# Patient Record
Sex: Male | Born: 1985 | Race: Black or African American | Hispanic: No | Marital: Single | Smoking: Current some day smoker
Health system: Southern US, Community
[De-identification: ages and names within clinical notes are randomized; demographics above are authoritative.]

## PROBLEM LIST (undated history)

## (undated) DIAGNOSIS — D573 Sickle-cell trait: Secondary | ICD-10-CM

---

## 2008-05-19 ENCOUNTER — Emergency Department: Payer: Self-pay | Admitting: Emergency Medicine

## 2009-04-19 ENCOUNTER — Emergency Department: Payer: Self-pay | Admitting: Emergency Medicine

## 2013-01-27 ENCOUNTER — Emergency Department: Payer: Self-pay | Admitting: Emergency Medicine

## 2014-05-23 ENCOUNTER — Ambulatory Visit: Payer: Self-pay | Admitting: Family Medicine

## 2015-06-11 IMAGING — CT CT CERVICAL SPINE WITHOUT CONTRAST
4 series · 16 of 33 positions shown, 19 images · non-contrast
Comparison: Prior radiograph performed earlier on the same day.

CLINICAL DATA: Pain following trauma, right arm weakness

EXAM:
CT CERVICAL SPINE WITHOUT CONTRAST
TECHNIQUE: Multidetector CT imaging of the cervical spine was performed without
intravenous contrast. Multiplanar CT image reconstructions were also
generated.

[Series 3: cor bone · coronal · 0.39mm/px · 3 of 42 slices shown]
[im 9/42  bone]
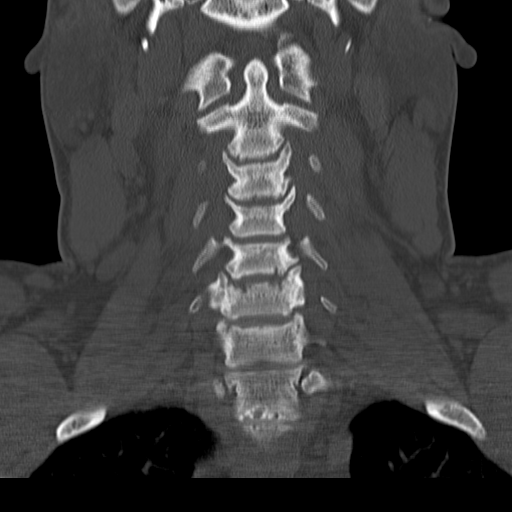
[im 17/42  bone]
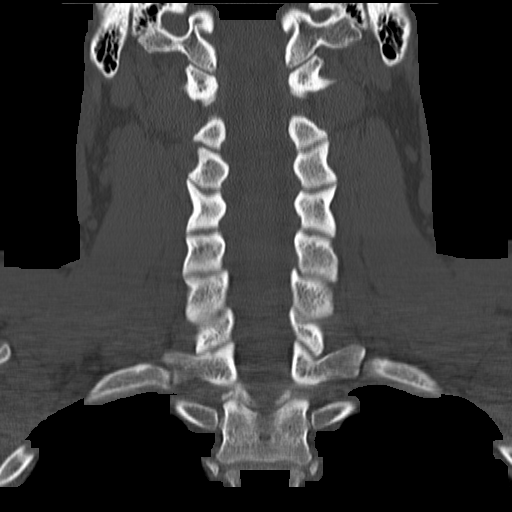
[im 25/42  bone]
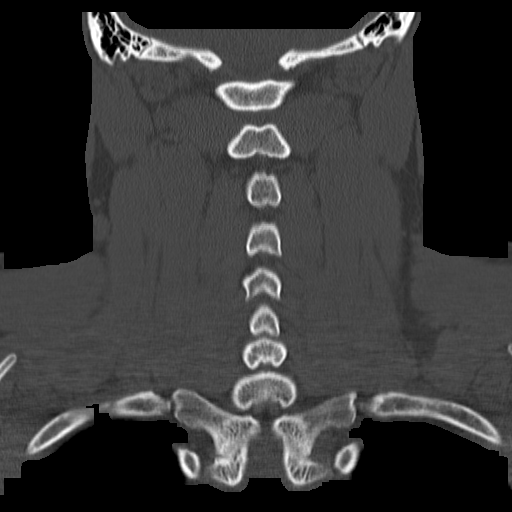

[Series 4: c spine soft · axial · 0.30mm/px · z∈[+938,+1000]mm · 3 of 93 slices shown]
[im 16/93  soft-tissue]
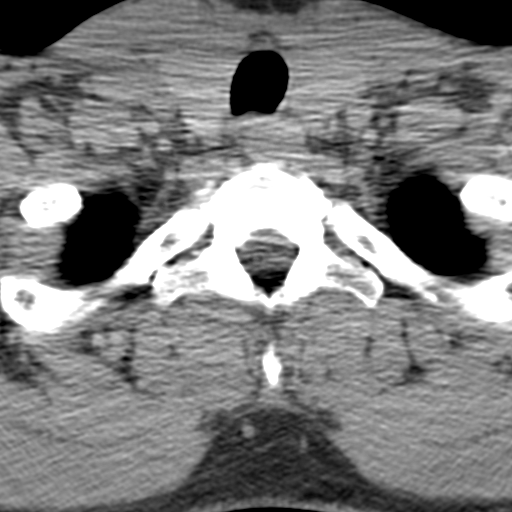
[im 31/93  soft-tissue]
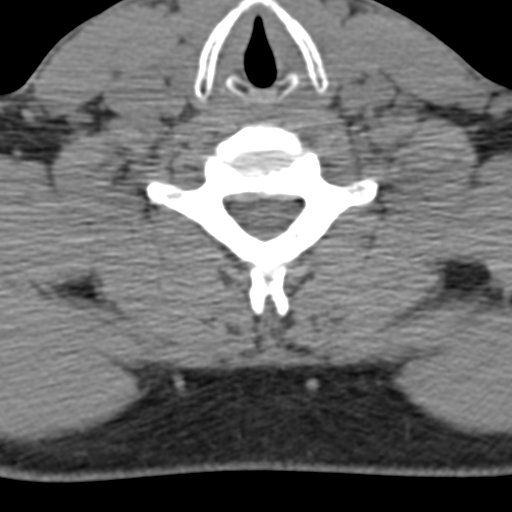
[im 47/93  soft-tissue]
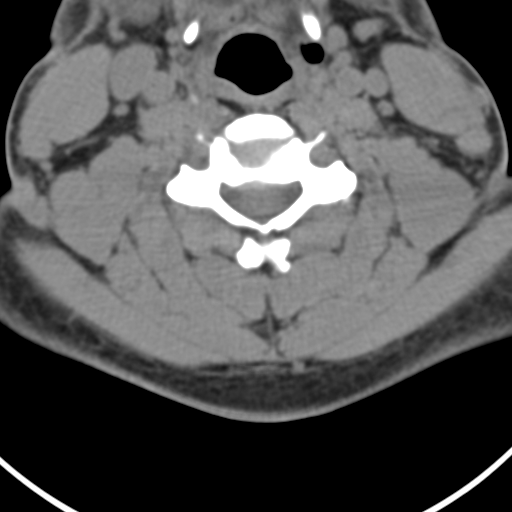

[Series 5: sag bone · sagittal · 0.39mm/px · 5 of 48 slices shown, 6 images]
[im 16/48  bone]
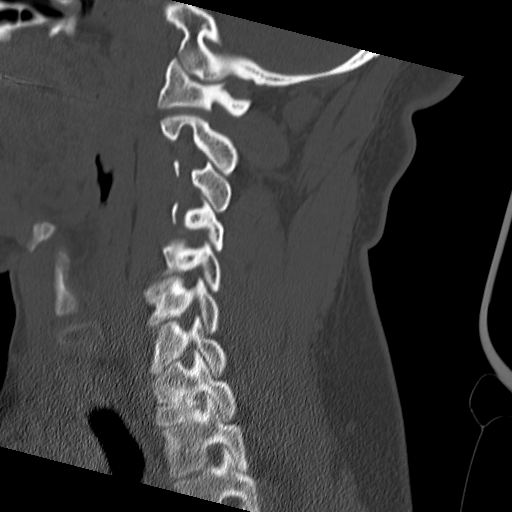
[im 20/48  bone]
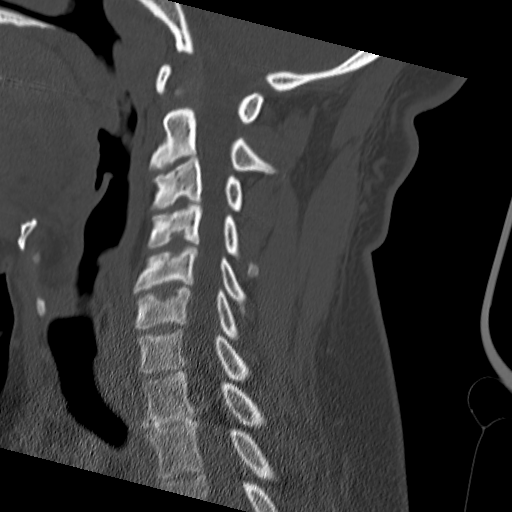
[im 24/48  soft-tissue]
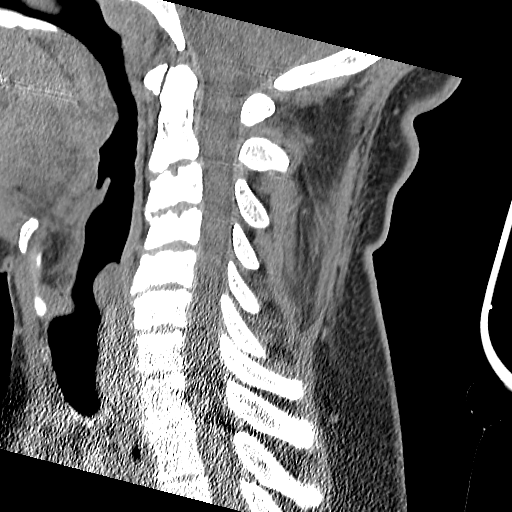
[im 24/48  bone]
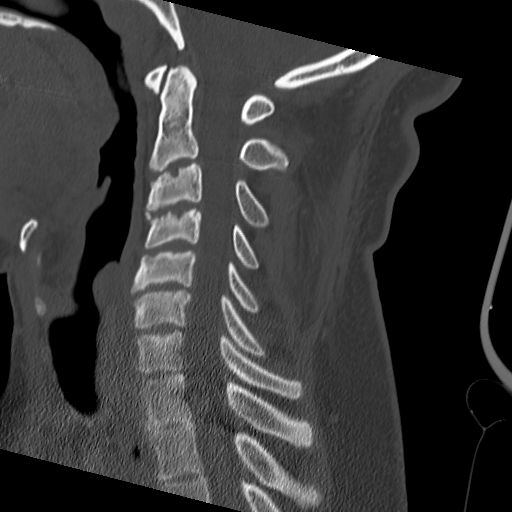
[im 28/48  bone]
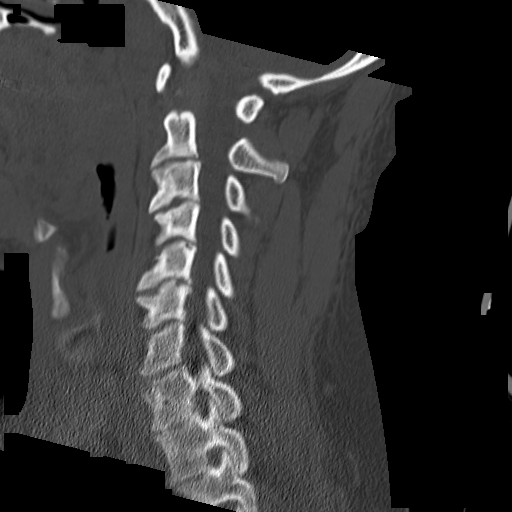
[im 32/48  bone]
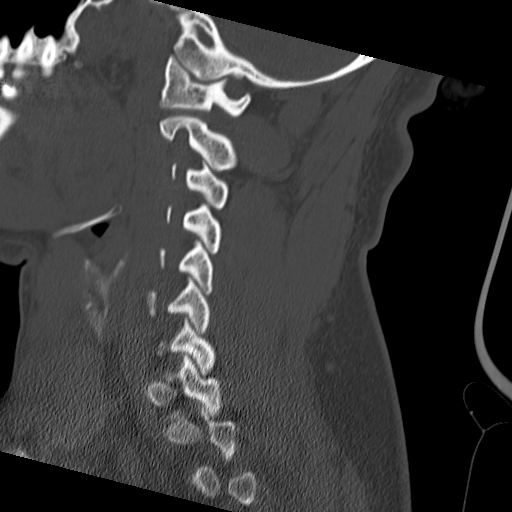

[Series 6: orthogonal axials · axial · 0.29mm/px · z∈[+921,+1037]mm · 5 of 92 slices shown, 7 images]
[im 16/92  soft-tissue]
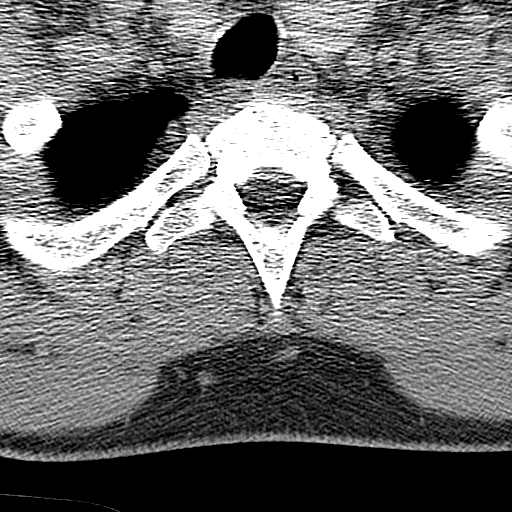
[im 16/92  bone]
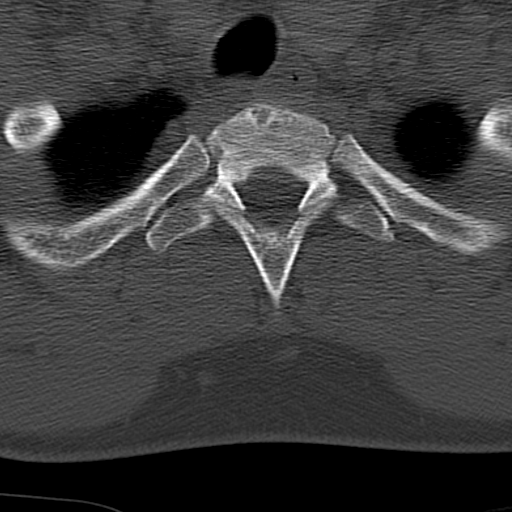
[im 31/92  bone]
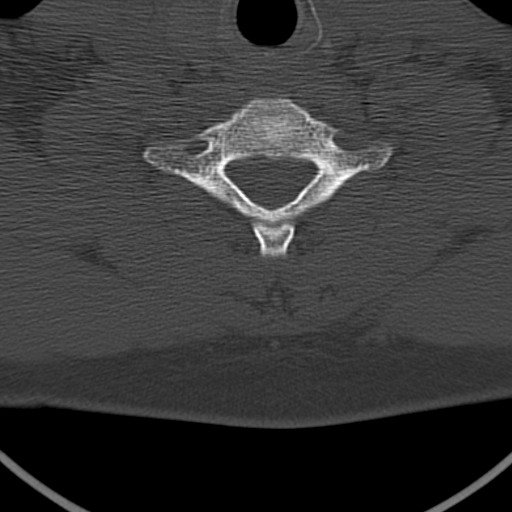
[im 46/92  bone]
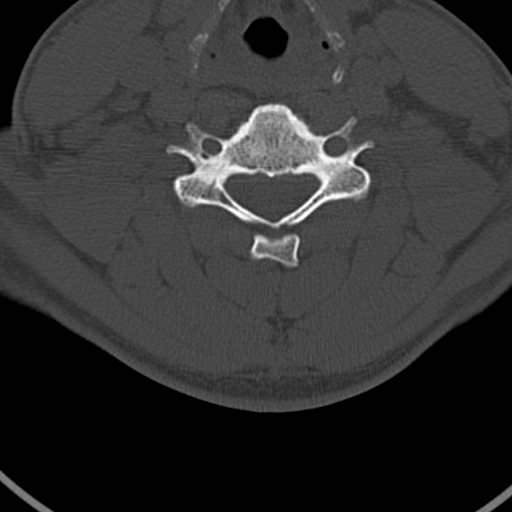
[im 61/92  bone]
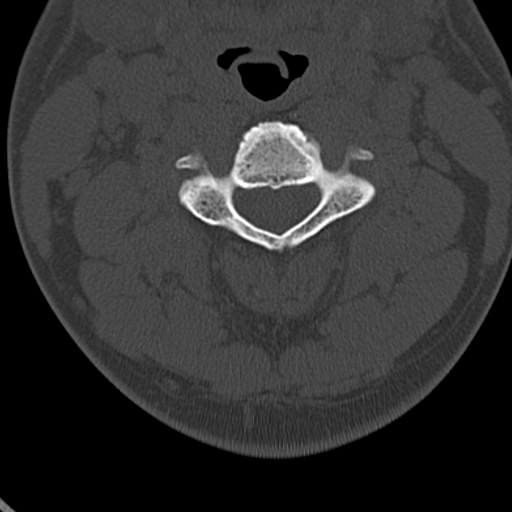
[im 76/92  soft-tissue]
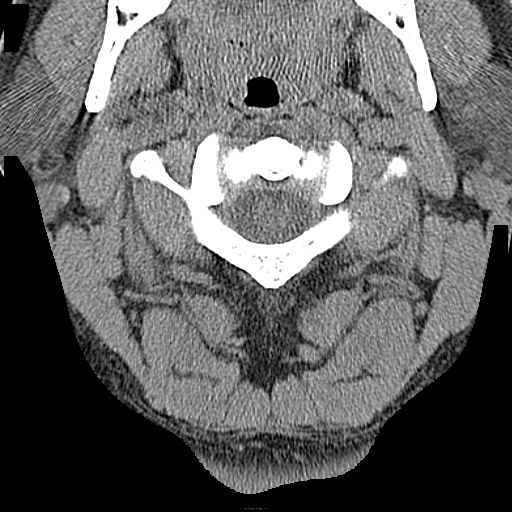
[im 76/92  bone]
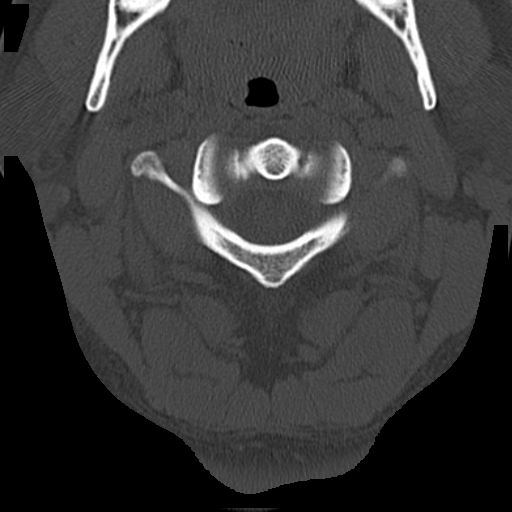

[16 of 33 positions shown; findings below may reference images not displayed]

FINDINGS: There is slight reversal the normal cervical lordosis with apex at
the C4 level. Multilevel degenerative Schmorl's nodes are seen, most
prominent at the superior endplates of C3, C4, and C6. There is
degenerative intervertebral disk space narrowing with endplate
osteophytosis throughout the cervical spine, most prominent at C3-4
and T1-2. Findings are relatively advanced for patient age.
Prevertebral soft tissues are normal. Normal C1-2 articulations are
intact. No acute fracture listhesis. Facet joints are normally
aligned.

The soft tissues of the neck are within normal limits. Visualized
lung apices are clear.
IMPRESSION: 1. No CT evidence of acute fracture or listhesis of the cervical
spine.
2. Multilevel degenerative disc disease with associated chronic
endplate changes and Schmorl's nodes, advanced for patient age.
These findings are most severe at C3-4.

## 2015-06-11 IMAGING — CR CERVICAL SPINE - COMPLETE 4+ VIEW
1 series · 6 of 6 positions shown · non-contrast
Comparison: None.

CLINICAL DATA: Fall 2 days ago, upper back and neck pain

EXAM:
CERVICAL SPINE  4+ VIEWS

[Series 1: w cervical spine lat · 0.14mm/px · 6 of 6 slices shown]
[im 1/6]
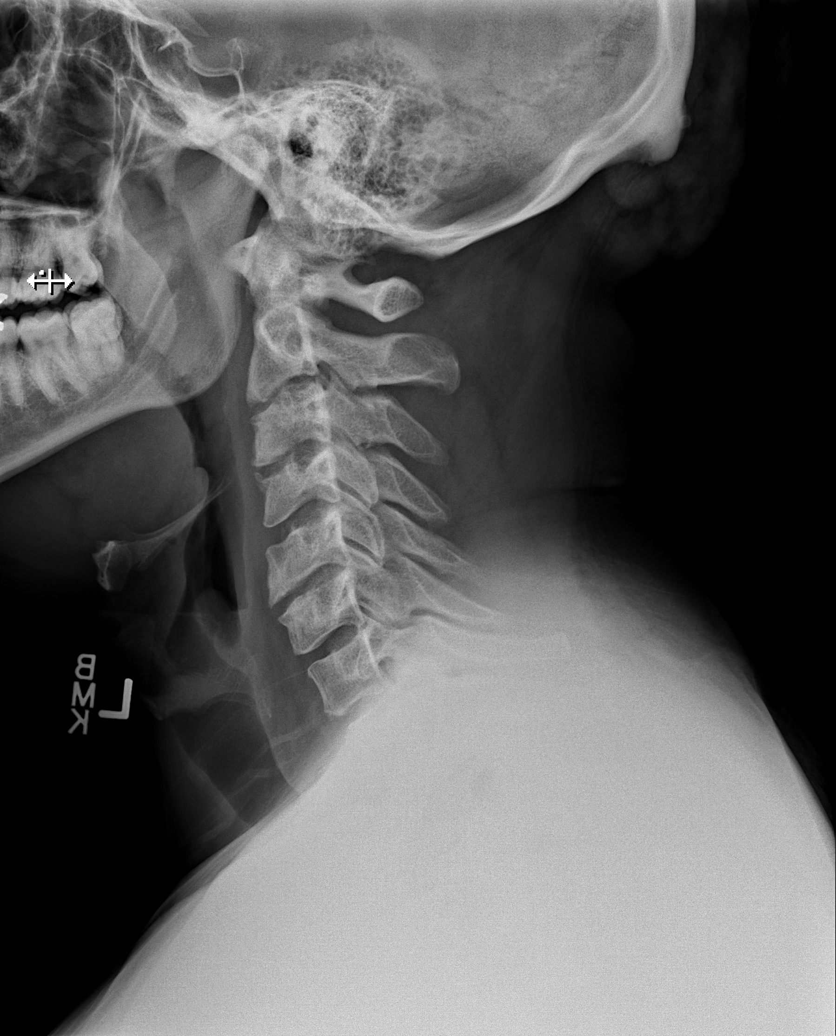
[im 2/6]
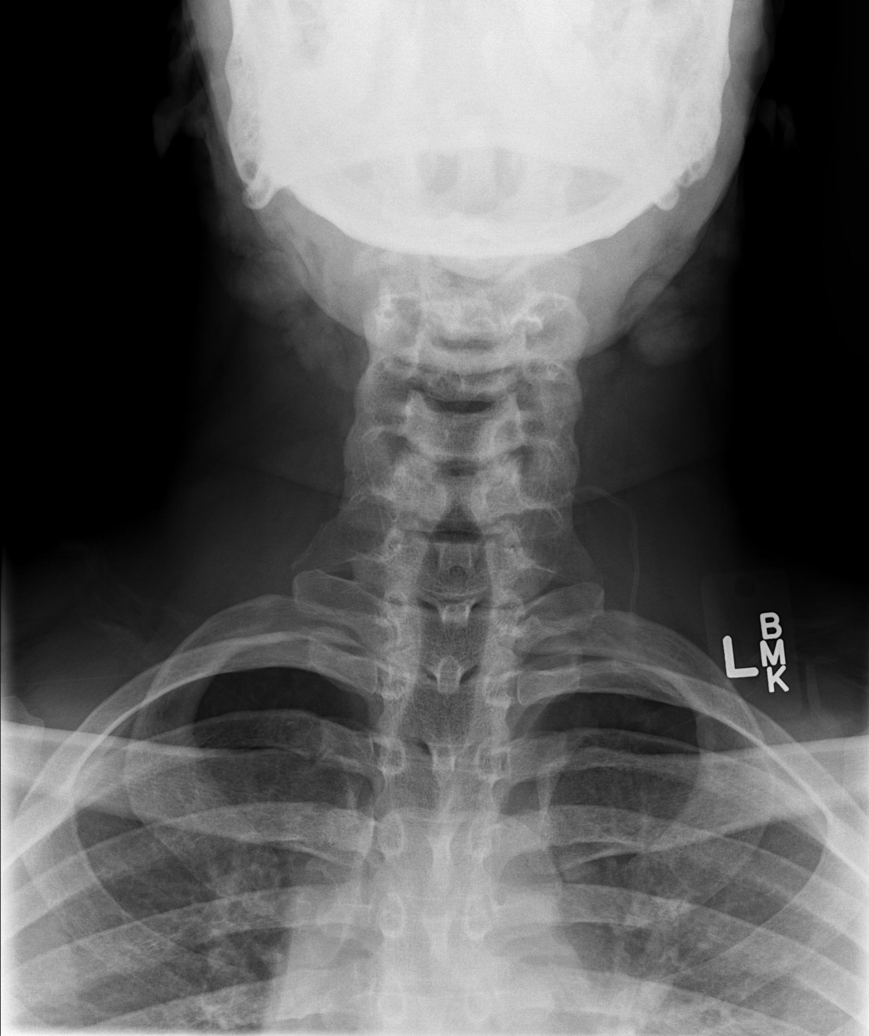
[im 3/6]
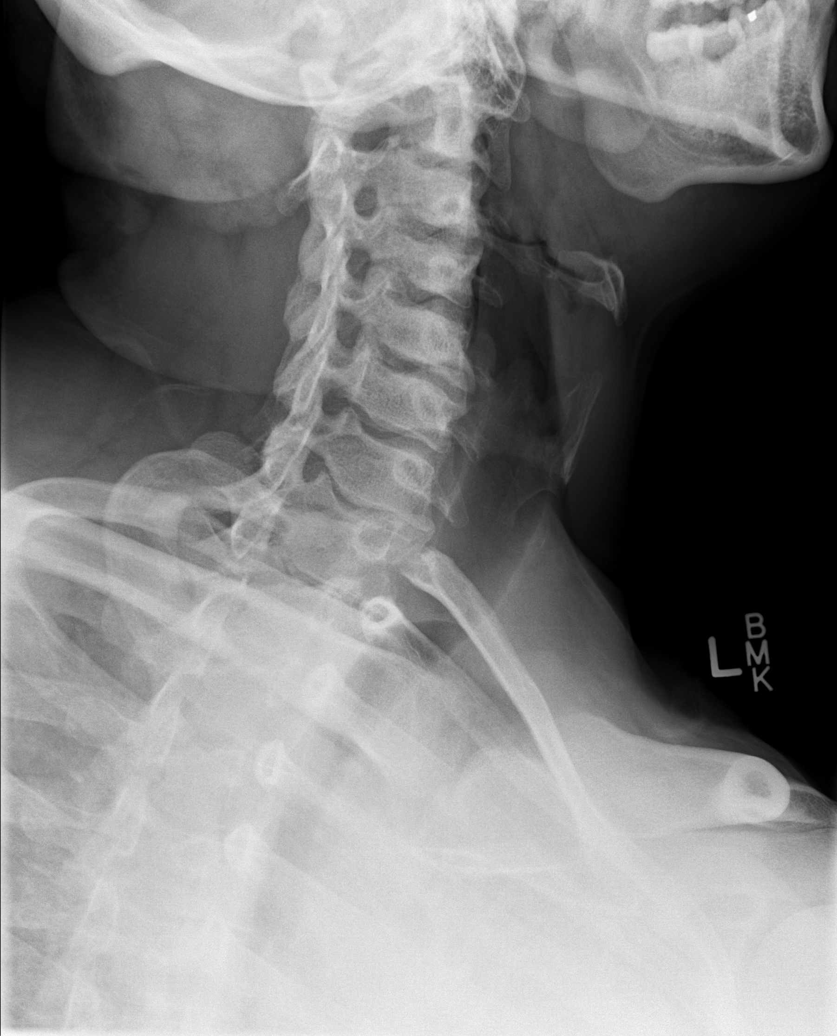
[im 4/6]
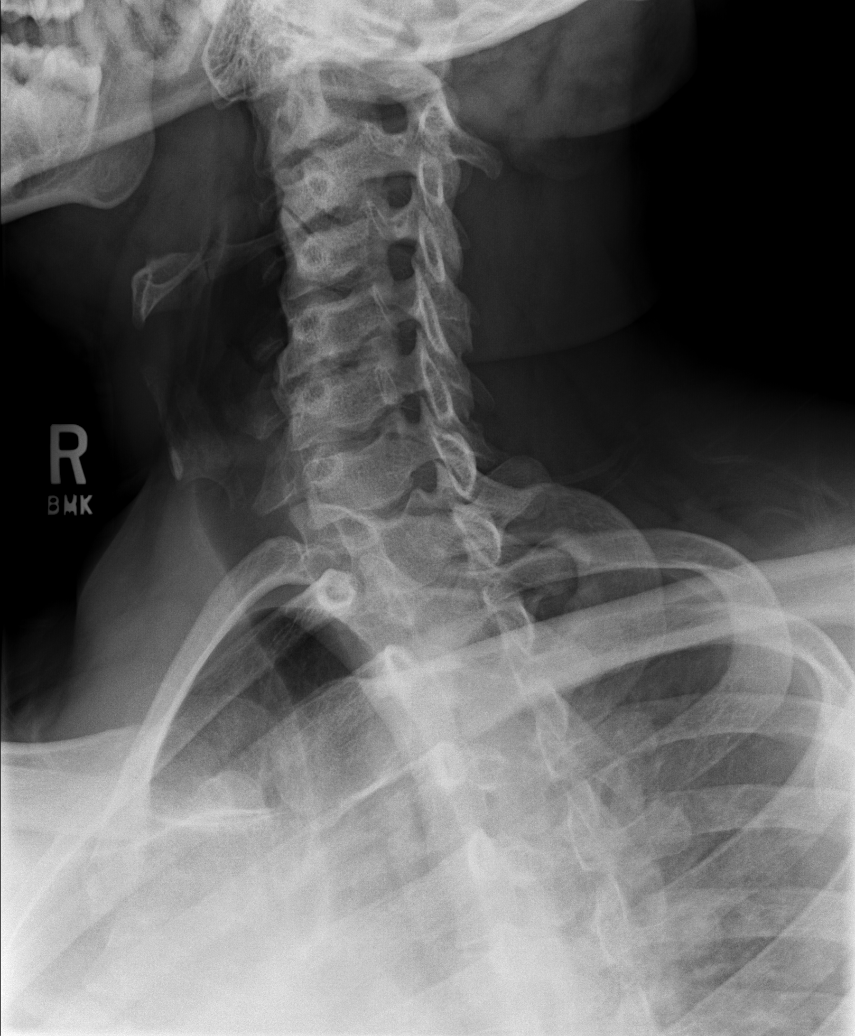
[im 5/6]
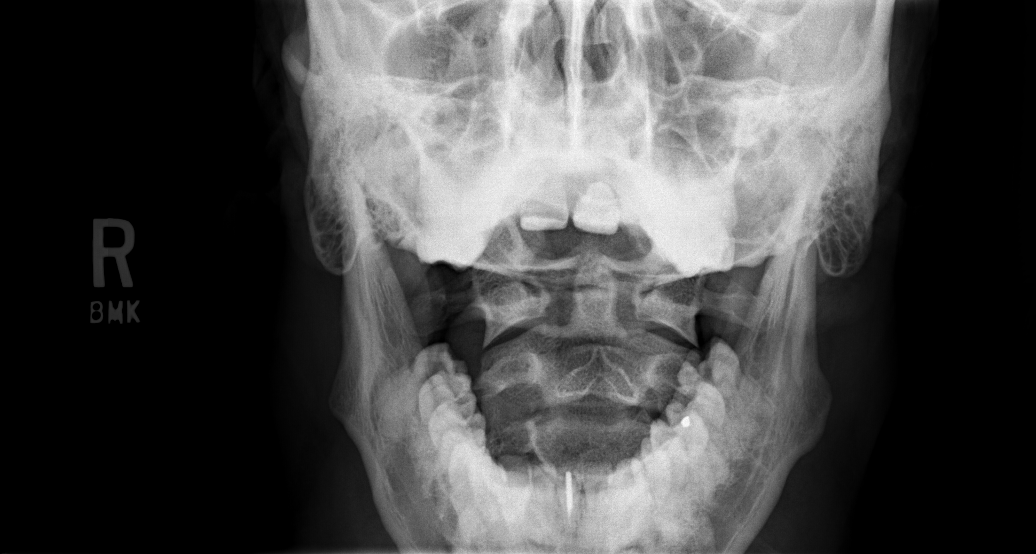
[im 6/6]
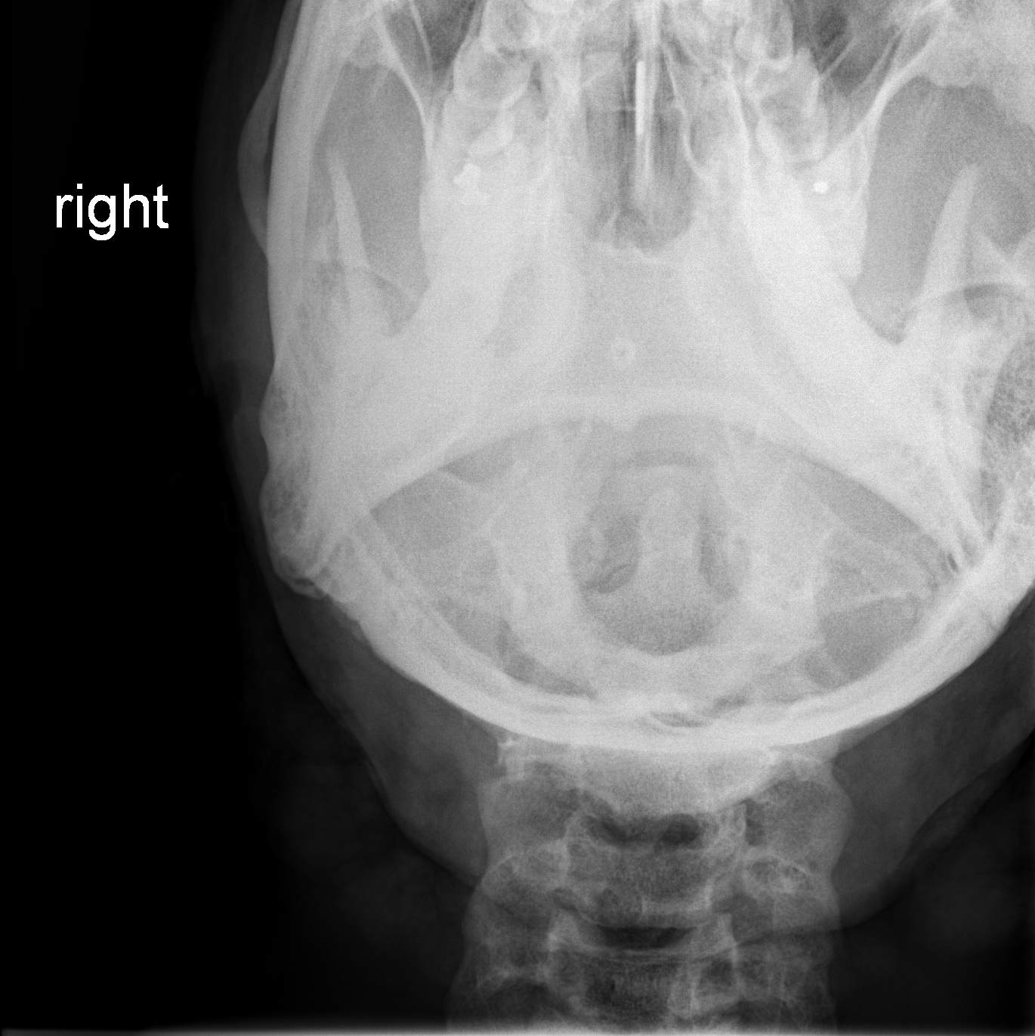

[6 of 6 positions shown; findings below may reference images not displayed]

FINDINGS: The vertebral bodies are normally aligned with preservation of the
normal lumbar lordosis. Degenerative Schmorl's node is present at
the superior endplate of C4. Vertebral body heights are preserved.
Multilevel degenerative disc disease as evidenced by intervertebral
disk space narrowing and endplate osteophytosis is present, most
prominent on at C3-4. No prevertebral soft tissue swelling. Normal
C1-2 articulations are intact and the dens is intact.

Airway is midline and patent. Curvilinear opacity overlying the
lower left neck likely lies external to the patient.
IMPRESSION: 1. No acute fracture listhesis within the cervical spine.
2. Multilevel degenerative disc disease, most severe at C3-4

## 2016-09-22 ENCOUNTER — Encounter: Payer: Self-pay | Admitting: Emergency Medicine

## 2016-09-22 ENCOUNTER — Emergency Department
Admission: EM | Admit: 2016-09-22 | Discharge: 2016-09-22 | Disposition: A | Discharge status: Home / Self Care | Payer: Self-pay | Attending: Emergency Medicine | Admitting: Emergency Medicine

## 2016-09-22 DIAGNOSIS — M5412 Radiculopathy, cervical region: Secondary | ICD-10-CM | POA: Insufficient documentation

## 2016-09-22 DIAGNOSIS — F17290 Nicotine dependence, other tobacco product, uncomplicated: Secondary | ICD-10-CM | POA: Insufficient documentation

## 2016-09-22 HISTORY — DX: Sickle-cell trait: D57.3

## 2016-09-22 MED ORDER — PREDNISONE 50 MG PO TABS: ORAL_TABLET | ORAL | Status: AC

## 2016-09-22 MED ORDER — METHYLPREDNISOLONE SODIUM SUCC 125 MG IJ SOLR
125.0000 mg | Freq: Once | INTRAMUSCULAR | Status: AC
  Administered 2016-09-22: 125 mg via INTRAMUSCULAR
  Filled 2016-09-22: qty 2

## 2016-09-22 NOTE — ED Notes (Signed)
Pt. Verbalizes understanding of d/c instructions, prescriptions, and follow-up. VS stable and Pt. In NAD at time of d/c and denies further concerns regarding this visit. Pt. Stable at the time of departure from the unit, departing unit by the safest and most appropriate manner per that pt condition and limitations. Pt advised to return to the ED at any time for emergent concerns, or for new/worsening symptoms.

## 2016-09-22 NOTE — ED Triage Notes (Signed)
Patient to ER for c/o right shoulder and arm pain. States pain travels down to fingertips and causes tingling in fingers. States he works as a [employer], has been having pain for 2 weeks, got worse in last couple of days.

## 2016-09-22 NOTE — ED Provider Notes (Signed)
[facility] Emergency Department Provider Note  ____________________________________________  Time seen: Approximately 10:01 PM  I have reviewed the triage vital signs and the nursing notes.   HISTORY  Chief Complaint Arm Pain    HPI Derrick Gomez is a 31 y.o. male presents to the emergency department with cervical radiculopathy. Patient states that he has numbness and tingling that radiates from his right shoulder to his fingertips. Patient's symptoms are reproduced with range of motion at the neck. Patient states that he had a MRI of his neck conducted last year which showed 3 bulging disks of the cervical spine. Patient states that he has experienced similar episodes of cervical radiculopathy in the past. Patient denies bowel or bladder incontinence. No alleviating measures have been attempted.   Past Medical History:  Diagnosis Date  . Sickle cell trait (HCC)     There are no active problems to display for this patient.   History reviewed. No pertinent surgical history.  Prior to Admission medications   Medication Sig Start Date End Date Taking? Authorizing Provider  predniSONE (DELTASONE) 50 MG tablet Take one tablet by mouth daily. 09/22/16   Orvil FeilWoods, Ladean Steinmeyer M, PA-C    Allergies Patient has no known allergies.  No family history on file.  Social History Social History  Substance Use Topics  . Smoking status: Current Some Day Smoker    Types: Cigars  . Smokeless tobacco: Never Used  . Alcohol use No     Review of Systems  Constitutional: No fever/chills Eyes: No visual changes. No discharge ENT: No upper respiratory complaints. Cardiovascular: no chest pain. Respiratory: no cough. No SOB. Gastrointestinal: No abdominal pain.  No nausea, no vomiting.  No diarrhea.  No constipation. Musculoskeletal: Negative for musculoskeletal pain. Skin: Negative for rash, abrasions, lacerations, ecchymosis. Neurological: Patient has cervical  radiculopathy  ____________________________________________   PHYSICAL EXAM:  VITAL SIGNS: ED Triage Vitals  Enc Vitals Group     BP 09/22/16 2021 (!) 153/93     Pulse Rate 09/22/16 2021 (!) 112     Resp 09/22/16 2021 20     Temp 09/22/16 2021 98.6 F (37 C)     Temp Source 09/22/16 2021 Oral     SpO2 09/22/16 2021 96 %     Weight 09/22/16 2022 235 lb (106.6 kg)     Height 09/22/16 2022 6' (1.829 m)     Head Circumference --      Peak Flow --      Pain Score 09/22/16 2020 10     Pain Loc --      Pain Edu? --      Excl. in GC? --      Constitutional: Alert and oriented. Well appearing and in no acute distress. Eyes: Conjunctivae are normal. PERRL. EOMI. Head: Atraumatic. Cardiovascular: Normal rate, regular rhythm. Normal S1 and S2.  Good peripheral circulation. Respiratory: Normal respiratory effort without tachypnea or retractions. Lungs CTAB. Good air entry to the bases with no decreased or absent breath sounds. Musculoskeletal: Full range of motion to all extremities. No gross deformities appreciated. Patient had limited range of motion at the neck. No pain was elicited with palpation along the cervical spine. Neurologic: Patient has radiculopathy of the right upper extremity Skin:  Skin is warm, dry and intact. No rash noted. Palpable radial and ulnar pulses bilaterally and symmetrically. Psychiatric: Mood and affect are normal. Speech and behavior are normal. Patient exhibits appropriate insight and judgement.   ____________________________________________   LABS (all  labs ordered are listed, but only abnormal results are displayed)  Labs Reviewed - No data to display ____________________________________________  EKG   ____________________________________________  RADIOLOGY   No results found.  ____________________________________________    PROCEDURES  Procedure(s) performed:    Procedures    Medications  methylPREDNISolone sodium succinate  (SOLU-MEDROL) 125 mg/2 mL injection 125 mg (not administered)     ____________________________________________   INITIAL IMPRESSION / ASSESSMENT AND PLAN / ED COURSE  Pertinent labs & imaging results that were available during my care of the patient were reviewed by me and considered in my medical decision making (see chart for details).  Review of the  [place] was performed in accordance of the [facility] prior to dispensing any controlled drugs.     Assessment and plan: Cervical Radiculopathy:  Patient presents to the emergency department with right upper extremity radiculopathy reproduced with range of motion at the neck. Patient was given an injection of Solu-Medrol and discharged with prednisone. Patient was advised to seek care with Dr. Marcell Barlow. Patient voiced understanding regarding this recommendation. All patient questions were answered.     ____________________________________________  FINAL CLINICAL IMPRESSION(S) / ED DIAGNOSES  Final diagnoses:  Cervical radiculopathy      NEW MEDICATIONS STARTED DURING THIS VISIT:  New Prescriptions   PREDNISONE (DELTASONE) 50 MG TABLET    Take one tablet by mouth daily.        This chart was dictated using voice recognition software/Dragon. Despite best efforts to proofread, errors can occur which can change the meaning. Any change was purely unintentional.    Orvil Feil, PA-C 09/23/16 1605    Jeanmarie Plant, MD 09/23/16 2241

## 2016-09-22 NOTE — ED Notes (Signed)
Pt also reports having sickle cell trait as well, but denies being a carrier.
